# Patient Record
Sex: Female | Born: 1985 | Race: White | Hispanic: No | Smoking: Never smoker
Health system: Southern US, Community
[De-identification: ages and names within clinical notes are randomized; demographics above are authoritative.]

## PROBLEM LIST (undated history)

## (undated) DIAGNOSIS — D649 Anemia, unspecified: Secondary | ICD-10-CM

---
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
## 2004-12-10 HISTORY — PX: RADIAL KERATOTOMY: SHX217

## 2017-11-08 ENCOUNTER — Encounter (HOSPITAL_COMMUNITY): Payer: Self-pay

## 2017-11-12 ENCOUNTER — Ambulatory Visit (HOSPITAL_COMMUNITY)
Admission: RE | Admit: 2017-11-12 | Discharge: 2017-11-12 | Disposition: A | Discharge status: Home / Self Care | Payer: No Typology Code available for payment source | Source: Ambulatory Visit | Attending: Nurse Practitioner | Admitting: Nurse Practitioner

## 2017-11-12 ENCOUNTER — Encounter (HOSPITAL_COMMUNITY): Payer: Self-pay

## 2017-11-12 DIAGNOSIS — Z3A12 12 weeks gestation of pregnancy: Secondary | ICD-10-CM | POA: Insufficient documentation

## 2017-11-12 DIAGNOSIS — O30031 Twin pregnancy, monochorionic/diamniotic, first trimester: Secondary | ICD-10-CM | POA: Diagnosis present

## 2017-11-12 DIAGNOSIS — O30019 Twin pregnancy, monochorionic/monoamniotic, unspecified trimester: Secondary | ICD-10-CM | POA: Insufficient documentation

## 2017-11-12 HISTORY — DX: Anemia, unspecified: D64.9

## 2017-11-12 NOTE — Consult Note (Signed)
Maternal Fetal Medicine Consultation  Requesting Provider(s): Arlyn LeakErskine (NP)  [facility] Reason for consultation: Monochorionic diamniotic twin gestation  HPI: 31yo [id number] at 12+1 weeks with a reported monochorionic/diamniotic twin gestation. This [facility]. We did not perform US to confirm this diagnosis today. I have reviewed the images from the scan on 11/07/2017 and they are suggestive but not diagnostic of monochorionic/diamniotic twin gestation. She has had c. 7 pound weight gain. She has nausea but no current vomiting OB History: OB History    Gravida Para Term Preterm AB Living   7 3 3   3 3    SAB TAB Ectopic Multiple Live Births   3            1.2008 C/S at term due to failure to progress, dilated to 8cm. Baby weight 8#8oz 2. 2009 8 week complete SAB 3. 2011 planned repeat C/S at term 8#13oz 4. 2013 incomplete SAB of confirmed monochorionic/monoamniotic twins at 7 weeks, required a D&C 5. 2015 planned repeat C/S at term, 8#8oz. 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 2016 8 weeks complete SAB  PMH:  Past Medical History:  Diagnosis Date  . Anemia     PSH:  Past Surgical History:  Procedure Laterality Date  . CESAREAN SECTION    . KNEE ARTHROSCOPY Left 2003  . RADIAL KERATOTOMY Bilateral 2006   Meds: PNV Allergies: NKDA FH: See EPIC section Soc: See EPIC section  Review of Systems: no vaginal bleeding or cramping/contractions, no LOF, no nausea/vomiting. All other systems reviewed and are negative.  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 We discussed the increased risks for adverse outcomes entailed by any type of twin pregnancy. These included increased risk of PTD,  preeclampsia and gestational diabetes. I have asked her to start 81mg  ASA for preeclampsia prevention. We will plan periodic transvaginal cervical evaluations, the first being at 18 weeks, and repeated every 2 to 4 weeks through 26 weeks to screen for elevated risk for PTD. Standard screening for GDM is all that is indicated at this time.  We then discussed increased risk for structural anomalies. This is elevated in all twin gestations, but is higher in MCDA than in DCDA gestations. Further, there is an increased risk for fetal cardiac defects. This is exacerbated by her history of a previous child with a VSD, even if it did close spontaneously. To that end we will plan anatomic survey at 18 weeks, and I have set her up for fetal echocardiography when the EGA is appropriate.  We then discussed the risk for complications specific to MCDA gestations, including twin-to-twin transfusion syndrome (TTTS), twin reversed arterial perfusion sequence (TRAP) and selective fetal growth restriction. TRAP will be easily identified at her first US and is unlikely. We will begin TTTS surveillance at 16 weeks and will perform it every 2 weeks until delivery. We will check growth at the anatomic survey at 18 weeks then every 4 weeks thereafter. We would recommend antepartum testing with weekly BPP after 30 weeks  Delivery should be at 37+0 weeks if the remainder of her pregnancy is uncomplicated. Expert opinion states that delivery from 36-37 weeks is acceptable for uncomplicated MCDA twins  Thank you for the opportunity to be  a part of the care of [facility]. Please contact our office if we can be of further assistance.   I spent approximately 40 minutes with this patient with over 50% of time spent in face-to-face counseling.

## 2017-11-13 ENCOUNTER — Encounter (HOSPITAL_COMMUNITY): Payer: Self-pay

## 2017-11-14 ENCOUNTER — Other Ambulatory Visit (HOSPITAL_COMMUNITY): Payer: Self-pay | Admitting: *Deleted

## 2017-11-14 DIAGNOSIS — O30039 Twin pregnancy, monochorionic/diamniotic, unspecified trimester: Secondary | ICD-10-CM

## 2017-12-13 ENCOUNTER — Ambulatory Visit (HOSPITAL_COMMUNITY)
Admission: RE | Admit: 2017-12-13 | Discharge: 2017-12-13 | Disposition: A | Discharge status: Home / Self Care | Payer: No Typology Code available for payment source | Source: Ambulatory Visit | Attending: Nurse Practitioner | Admitting: Nurse Practitioner

## 2017-12-13 ENCOUNTER — Encounter (HOSPITAL_COMMUNITY): Payer: Self-pay

## 2017-12-13 DIAGNOSIS — Z3A16 16 weeks gestation of pregnancy: Secondary | ICD-10-CM | POA: Diagnosis not present

## 2017-12-13 DIAGNOSIS — O09292 Supervision of pregnancy with other poor reproductive or obstetric history, second trimester: Secondary | ICD-10-CM | POA: Diagnosis not present

## 2017-12-13 DIAGNOSIS — O322XX2 Maternal care for transverse and oblique lie, fetus 2: Secondary | ICD-10-CM | POA: Insufficient documentation

## 2017-12-13 DIAGNOSIS — O30032 Twin pregnancy, monochorionic/diamniotic, second trimester: Secondary | ICD-10-CM | POA: Diagnosis present

## 2017-12-13 DIAGNOSIS — O34219 Maternal care for unspecified type scar from previous cesarean delivery: Secondary | ICD-10-CM | POA: Insufficient documentation

## 2017-12-13 DIAGNOSIS — O30039 Twin pregnancy, monochorionic/diamniotic, unspecified trimester: Secondary | ICD-10-CM

## 2017-12-23 ENCOUNTER — Encounter (HOSPITAL_COMMUNITY): Payer: Self-pay | Admitting: Nurse Practitioner

## 2017-12-27 ENCOUNTER — Other Ambulatory Visit (HOSPITAL_COMMUNITY): Payer: Self-pay | Admitting: Obstetrics and Gynecology

## 2017-12-27 ENCOUNTER — Other Ambulatory Visit (HOSPITAL_COMMUNITY): Payer: Self-pay | Admitting: *Deleted

## 2017-12-27 ENCOUNTER — Encounter (HOSPITAL_COMMUNITY): Payer: Self-pay

## 2017-12-27 ENCOUNTER — Ambulatory Visit (HOSPITAL_COMMUNITY)
Admission: RE | Admit: 2017-12-27 | Discharge: 2017-12-27 | Disposition: A | Discharge status: Home / Self Care | Payer: No Typology Code available for payment source | Source: Ambulatory Visit | Attending: Nurse Practitioner | Admitting: Nurse Practitioner

## 2017-12-27 DIAGNOSIS — O34219 Maternal care for unspecified type scar from previous cesarean delivery: Secondary | ICD-10-CM

## 2017-12-27 DIAGNOSIS — O30032 Twin pregnancy, monochorionic/diamniotic, second trimester: Secondary | ICD-10-CM | POA: Insufficient documentation

## 2017-12-27 DIAGNOSIS — Z363 Encounter for antenatal screening for malformations: Secondary | ICD-10-CM | POA: Insufficient documentation

## 2017-12-27 DIAGNOSIS — O30039 Twin pregnancy, monochorionic/diamniotic, unspecified trimester: Secondary | ICD-10-CM

## 2017-12-27 DIAGNOSIS — Z3A18 18 weeks gestation of pregnancy: Secondary | ICD-10-CM | POA: Insufficient documentation

## 2018-01-10 ENCOUNTER — Encounter (HOSPITAL_COMMUNITY): Payer: Self-pay

## 2018-01-10 ENCOUNTER — Ambulatory Visit (HOSPITAL_COMMUNITY)
Admission: RE | Admit: 2018-01-10 | Discharge: 2018-01-10 | Disposition: A | Discharge status: Home / Self Care | Payer: No Typology Code available for payment source | Source: Ambulatory Visit | Attending: Nurse Practitioner | Admitting: Nurse Practitioner

## 2018-01-10 DIAGNOSIS — O30039 Twin pregnancy, monochorionic/diamniotic, unspecified trimester: Secondary | ICD-10-CM | POA: Diagnosis not present

## 2018-01-10 DIAGNOSIS — Z3A20 20 weeks gestation of pregnancy: Secondary | ICD-10-CM | POA: Insufficient documentation

## 2018-01-17 ENCOUNTER — Other Ambulatory Visit (HOSPITAL_COMMUNITY): Payer: Self-pay

## 2018-01-24 ENCOUNTER — Other Ambulatory Visit (HOSPITAL_COMMUNITY): Payer: Self-pay | Admitting: Obstetrics and Gynecology

## 2018-01-24 ENCOUNTER — Encounter (HOSPITAL_COMMUNITY): Payer: Self-pay

## 2018-01-24 ENCOUNTER — Ambulatory Visit (HOSPITAL_COMMUNITY)
Admission: RE | Admit: 2018-01-24 | Discharge: 2018-01-24 | Disposition: A | Discharge status: Home / Self Care | Payer: No Typology Code available for payment source | Source: Ambulatory Visit | Attending: Nurse Practitioner | Admitting: Nurse Practitioner

## 2018-01-24 DIAGNOSIS — O30032 Twin pregnancy, monochorionic/diamniotic, second trimester: Secondary | ICD-10-CM | POA: Diagnosis present

## 2018-01-24 DIAGNOSIS — Z3A22 22 weeks gestation of pregnancy: Secondary | ICD-10-CM

## 2018-01-24 DIAGNOSIS — O09299 Supervision of pregnancy with other poor reproductive or obstetric history, unspecified trimester: Secondary | ICD-10-CM

## 2018-01-24 DIAGNOSIS — O34219 Maternal care for unspecified type scar from previous cesarean delivery: Secondary | ICD-10-CM | POA: Diagnosis not present

## 2018-01-24 DIAGNOSIS — O30039 Twin pregnancy, monochorionic/diamniotic, unspecified trimester: Secondary | ICD-10-CM

## 2018-01-24 DIAGNOSIS — Z362 Encounter for other antenatal screening follow-up: Secondary | ICD-10-CM | POA: Diagnosis not present

## 2018-01-24 DIAGNOSIS — O09292 Supervision of pregnancy with other poor reproductive or obstetric history, second trimester: Secondary | ICD-10-CM | POA: Insufficient documentation

## 2018-02-07 ENCOUNTER — Other Ambulatory Visit (HOSPITAL_COMMUNITY): Payer: Self-pay | Admitting: Obstetrics and Gynecology

## 2018-02-07 ENCOUNTER — Ambulatory Visit (HOSPITAL_COMMUNITY)
Admission: RE | Admit: 2018-02-07 | Discharge: 2018-02-07 | Disposition: A | Discharge status: Home / Self Care | Payer: No Typology Code available for payment source | Source: Ambulatory Visit | Attending: Nurse Practitioner | Admitting: Nurse Practitioner

## 2018-02-07 ENCOUNTER — Encounter (HOSPITAL_COMMUNITY): Payer: Self-pay

## 2018-02-07 DIAGNOSIS — O30039 Twin pregnancy, monochorionic/diamniotic, unspecified trimester: Secondary | ICD-10-CM

## 2018-02-07 DIAGNOSIS — O09292 Supervision of pregnancy with other poor reproductive or obstetric history, second trimester: Secondary | ICD-10-CM | POA: Diagnosis not present

## 2018-02-07 DIAGNOSIS — O30032 Twin pregnancy, monochorionic/diamniotic, second trimester: Secondary | ICD-10-CM | POA: Diagnosis present

## 2018-02-07 DIAGNOSIS — Z3A24 24 weeks gestation of pregnancy: Secondary | ICD-10-CM | POA: Insufficient documentation

## 2018-02-07 DIAGNOSIS — O34219 Maternal care for unspecified type scar from previous cesarean delivery: Secondary | ICD-10-CM | POA: Insufficient documentation

## 2018-02-21 ENCOUNTER — Ambulatory Visit (HOSPITAL_COMMUNITY)
Admission: RE | Admit: 2018-02-21 | Discharge: 2018-02-21 | Disposition: A | Discharge status: Home / Self Care | Payer: No Typology Code available for payment source | Source: Ambulatory Visit | Attending: Nurse Practitioner | Admitting: Nurse Practitioner

## 2018-02-21 ENCOUNTER — Encounter (HOSPITAL_COMMUNITY): Payer: Self-pay

## 2018-02-21 ENCOUNTER — Other Ambulatory Visit (HOSPITAL_COMMUNITY): Payer: Self-pay | Admitting: Obstetrics and Gynecology

## 2018-02-21 DIAGNOSIS — Z3A26 26 weeks gestation of pregnancy: Secondary | ICD-10-CM

## 2018-02-21 DIAGNOSIS — O34219 Maternal care for unspecified type scar from previous cesarean delivery: Secondary | ICD-10-CM | POA: Diagnosis not present

## 2018-02-21 DIAGNOSIS — O09292 Supervision of pregnancy with other poor reproductive or obstetric history, second trimester: Secondary | ICD-10-CM | POA: Diagnosis not present

## 2018-02-21 DIAGNOSIS — O30039 Twin pregnancy, monochorionic/diamniotic, unspecified trimester: Secondary | ICD-10-CM

## 2018-02-21 DIAGNOSIS — O09299 Supervision of pregnancy with other poor reproductive or obstetric history, unspecified trimester: Secondary | ICD-10-CM

## 2018-02-21 DIAGNOSIS — O30032 Twin pregnancy, monochorionic/diamniotic, second trimester: Secondary | ICD-10-CM | POA: Insufficient documentation

## 2018-02-21 NOTE — ED Notes (Signed)
Pt reports itching all over, bile acids drawn 02/19/18.

## 2018-03-07 ENCOUNTER — Ambulatory Visit (HOSPITAL_COMMUNITY)
Admission: RE | Admit: 2018-03-07 | Discharge: 2018-03-07 | Disposition: A | Discharge status: Home / Self Care | Payer: No Typology Code available for payment source | Source: Ambulatory Visit | Attending: Nurse Practitioner | Admitting: Nurse Practitioner

## 2018-03-07 ENCOUNTER — Encounter (HOSPITAL_COMMUNITY): Payer: Self-pay

## 2018-03-07 ENCOUNTER — Other Ambulatory Visit (HOSPITAL_COMMUNITY): Payer: Self-pay | Admitting: Obstetrics and Gynecology

## 2018-03-07 DIAGNOSIS — O09293 Supervision of pregnancy with other poor reproductive or obstetric history, third trimester: Secondary | ICD-10-CM

## 2018-03-07 DIAGNOSIS — Z3A28 28 weeks gestation of pregnancy: Secondary | ICD-10-CM | POA: Diagnosis not present

## 2018-03-07 DIAGNOSIS — O34219 Maternal care for unspecified type scar from previous cesarean delivery: Secondary | ICD-10-CM | POA: Diagnosis not present

## 2018-03-07 DIAGNOSIS — O30032 Twin pregnancy, monochorionic/diamniotic, second trimester: Secondary | ICD-10-CM | POA: Insufficient documentation

## 2018-03-07 DIAGNOSIS — O30039 Twin pregnancy, monochorionic/diamniotic, unspecified trimester: Secondary | ICD-10-CM

## 2018-03-21 ENCOUNTER — Other Ambulatory Visit (HOSPITAL_COMMUNITY): Payer: Self-pay | Admitting: *Deleted

## 2018-03-21 ENCOUNTER — Ambulatory Visit (HOSPITAL_COMMUNITY)
Admission: RE | Admit: 2018-03-21 | Discharge: 2018-03-21 | Disposition: A | Discharge status: Home / Self Care | Payer: No Typology Code available for payment source | Source: Ambulatory Visit | Attending: Nurse Practitioner | Admitting: Nurse Practitioner

## 2018-03-21 ENCOUNTER — Other Ambulatory Visit (HOSPITAL_COMMUNITY): Payer: Self-pay | Admitting: Obstetrics and Gynecology

## 2018-03-21 ENCOUNTER — Encounter (HOSPITAL_COMMUNITY): Payer: Self-pay

## 2018-03-21 DIAGNOSIS — O34219 Maternal care for unspecified type scar from previous cesarean delivery: Secondary | ICD-10-CM | POA: Diagnosis not present

## 2018-03-21 DIAGNOSIS — Z3A30 30 weeks gestation of pregnancy: Secondary | ICD-10-CM | POA: Insufficient documentation

## 2018-03-21 DIAGNOSIS — O09293 Supervision of pregnancy with other poor reproductive or obstetric history, third trimester: Secondary | ICD-10-CM | POA: Diagnosis not present

## 2018-03-21 DIAGNOSIS — O30033 Twin pregnancy, monochorionic/diamniotic, third trimester: Secondary | ICD-10-CM | POA: Insufficient documentation

## 2018-03-21 DIAGNOSIS — O09299 Supervision of pregnancy with other poor reproductive or obstetric history, unspecified trimester: Secondary | ICD-10-CM

## 2018-03-21 DIAGNOSIS — O30039 Twin pregnancy, monochorionic/diamniotic, unspecified trimester: Secondary | ICD-10-CM

## 2018-04-04 ENCOUNTER — Encounter (HOSPITAL_COMMUNITY): Payer: Self-pay

## 2018-04-04 ENCOUNTER — Ambulatory Visit (HOSPITAL_COMMUNITY)
Admission: RE | Admit: 2018-04-04 | Discharge: 2018-04-04 | Disposition: A | Discharge status: Home / Self Care | Payer: No Typology Code available for payment source | Source: Ambulatory Visit | Attending: Nurse Practitioner | Admitting: Nurse Practitioner

## 2018-04-04 ENCOUNTER — Other Ambulatory Visit (HOSPITAL_COMMUNITY): Payer: Self-pay | Admitting: Obstetrics and Gynecology

## 2018-04-04 DIAGNOSIS — O34219 Maternal care for unspecified type scar from previous cesarean delivery: Secondary | ICD-10-CM | POA: Insufficient documentation

## 2018-04-04 DIAGNOSIS — O09299 Supervision of pregnancy with other poor reproductive or obstetric history, unspecified trimester: Secondary | ICD-10-CM

## 2018-04-04 DIAGNOSIS — Z3A32 32 weeks gestation of pregnancy: Secondary | ICD-10-CM | POA: Diagnosis not present

## 2018-04-04 DIAGNOSIS — O09293 Supervision of pregnancy with other poor reproductive or obstetric history, third trimester: Secondary | ICD-10-CM | POA: Insufficient documentation

## 2018-04-04 DIAGNOSIS — O30039 Twin pregnancy, monochorionic/diamniotic, unspecified trimester: Secondary | ICD-10-CM

## 2018-04-04 DIAGNOSIS — O30033 Twin pregnancy, monochorionic/diamniotic, third trimester: Secondary | ICD-10-CM | POA: Diagnosis not present

## 2018-04-18 ENCOUNTER — Encounter (HOSPITAL_COMMUNITY): Payer: Self-pay

## 2018-04-18 ENCOUNTER — Other Ambulatory Visit (HOSPITAL_COMMUNITY): Payer: Self-pay | Admitting: Maternal and Fetal Medicine

## 2018-04-18 ENCOUNTER — Other Ambulatory Visit (HOSPITAL_COMMUNITY): Payer: Self-pay | Admitting: Obstetrics and Gynecology

## 2018-04-18 ENCOUNTER — Ambulatory Visit (HOSPITAL_COMMUNITY)
Admission: RE | Admit: 2018-04-18 | Discharge: 2018-04-18 | Disposition: A | Discharge status: Home / Self Care | Payer: No Typology Code available for payment source | Source: Ambulatory Visit | Attending: Nurse Practitioner | Admitting: Nurse Practitioner

## 2018-04-18 DIAGNOSIS — O34219 Maternal care for unspecified type scar from previous cesarean delivery: Secondary | ICD-10-CM

## 2018-04-18 DIAGNOSIS — O09299 Supervision of pregnancy with other poor reproductive or obstetric history, unspecified trimester: Secondary | ICD-10-CM

## 2018-04-18 DIAGNOSIS — Z3A34 34 weeks gestation of pregnancy: Secondary | ICD-10-CM

## 2018-04-18 DIAGNOSIS — O09293 Supervision of pregnancy with other poor reproductive or obstetric history, third trimester: Secondary | ICD-10-CM | POA: Insufficient documentation

## 2018-04-18 DIAGNOSIS — O30039 Twin pregnancy, monochorionic/diamniotic, unspecified trimester: Secondary | ICD-10-CM

## 2018-04-18 DIAGNOSIS — O30033 Twin pregnancy, monochorionic/diamniotic, third trimester: Secondary | ICD-10-CM | POA: Insufficient documentation

## 2018-05-02 ENCOUNTER — Other Ambulatory Visit (HOSPITAL_COMMUNITY): Payer: No Typology Code available for payment source

## 2018-07-14 IMAGING — US US MFM OB LIMITED
2 series · 14 of 26 positions shown · non-contrast
Comparison: none

[Series 1: us mfm ob limited · 25 acquisitions, 13 frames shown (1 of 2)]
[im 1/25]
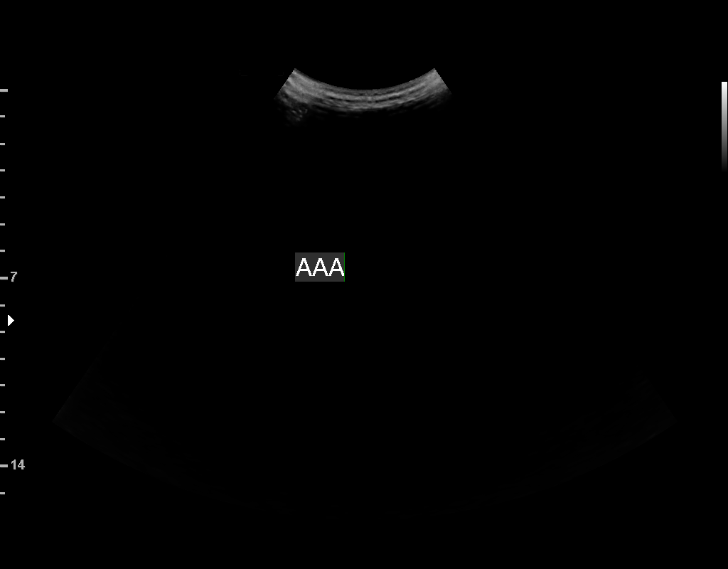
[im 3/25]
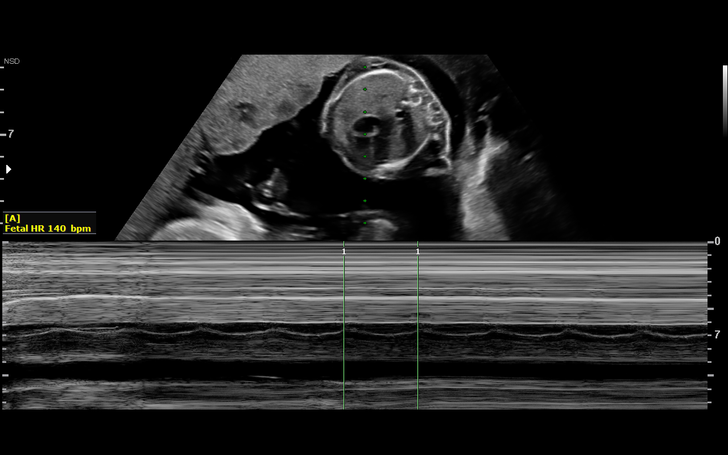
[im 5/25]
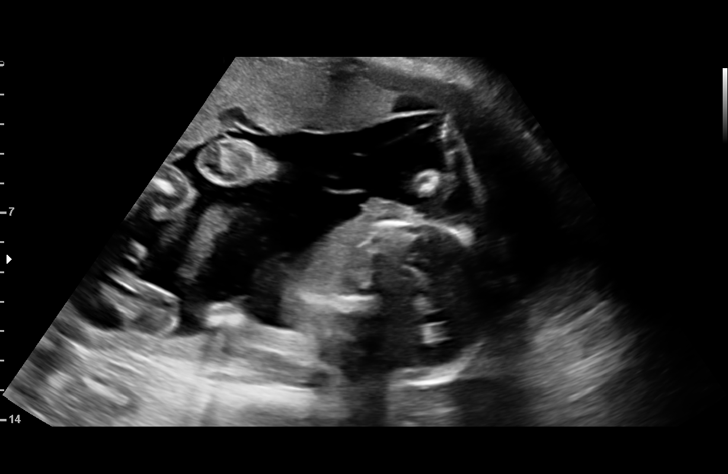
[im 7/25]
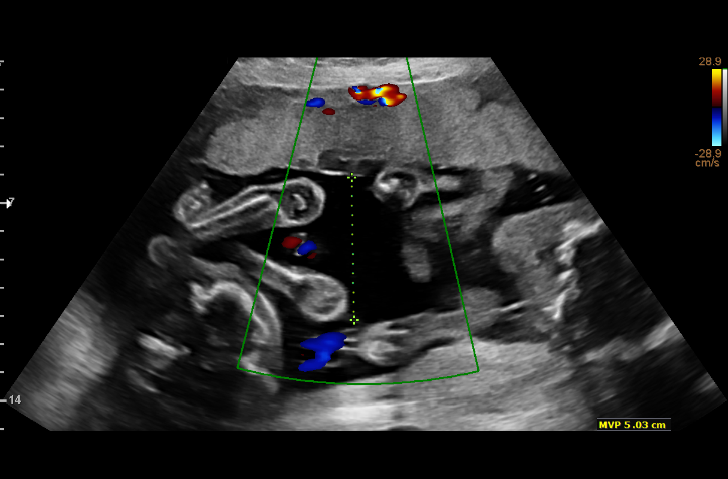
[im 9/25]
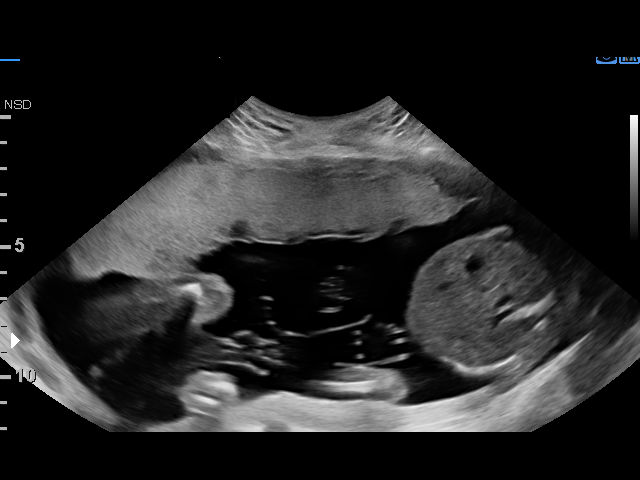
[im 11/25]
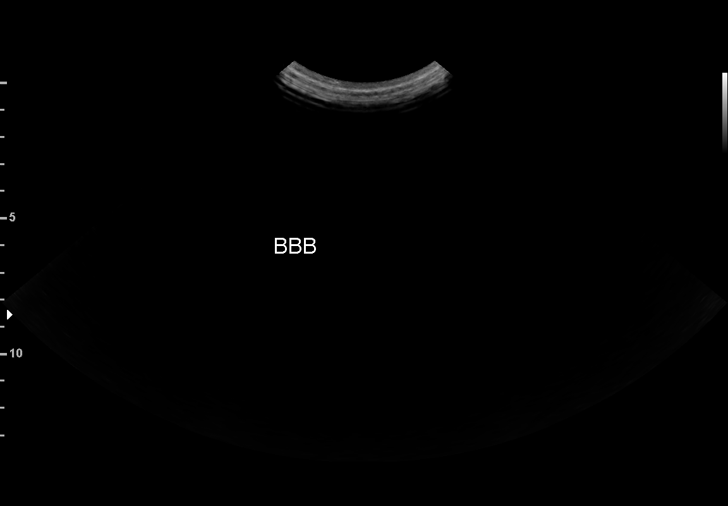
[im 13/25]
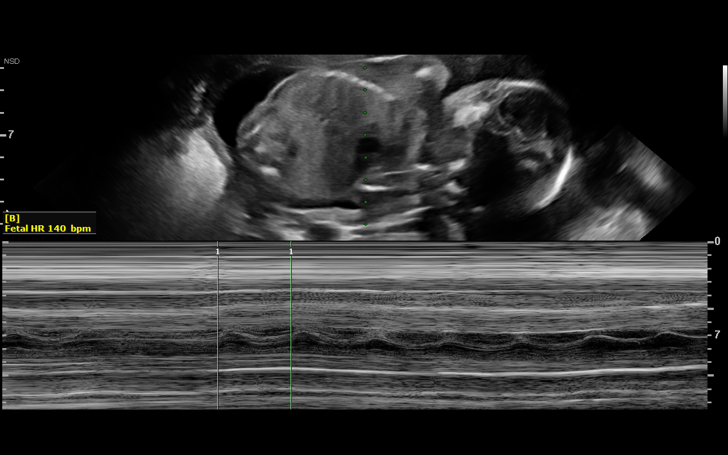
[im 14/25]
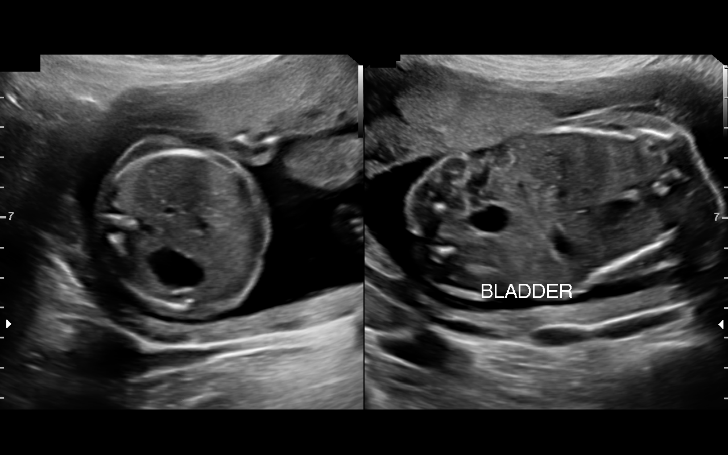
[im 16/25]
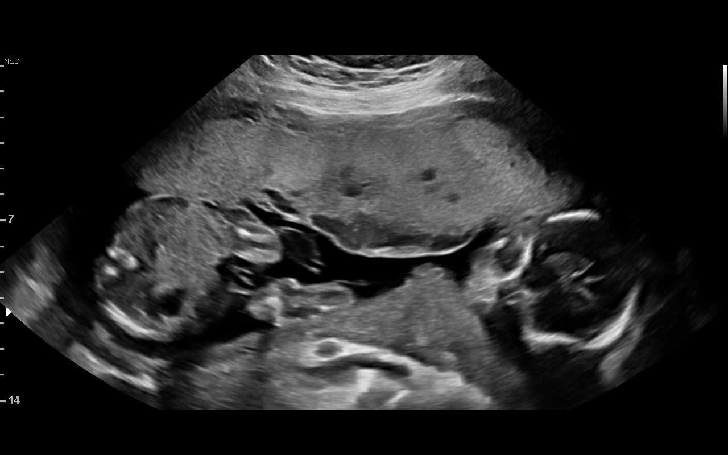
[im 18/25]
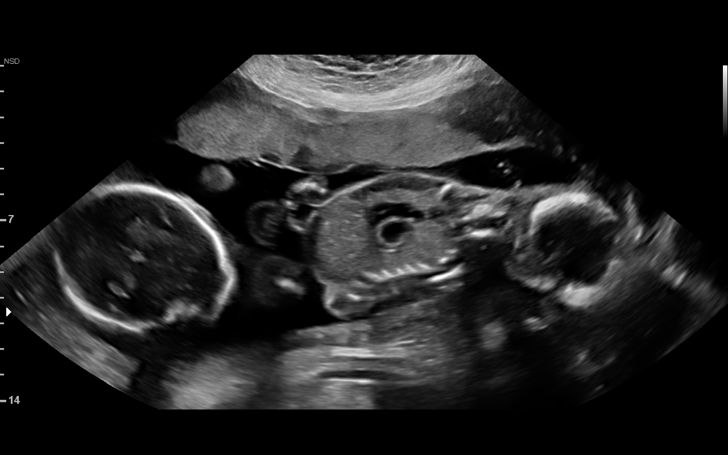
[im 20/25]
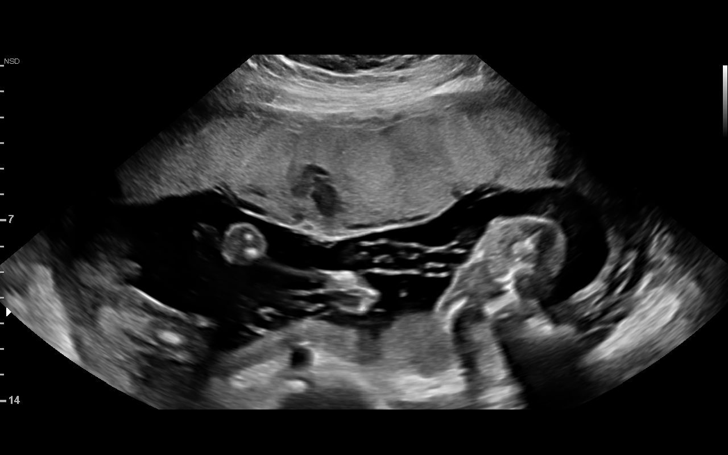
[im 22/25]
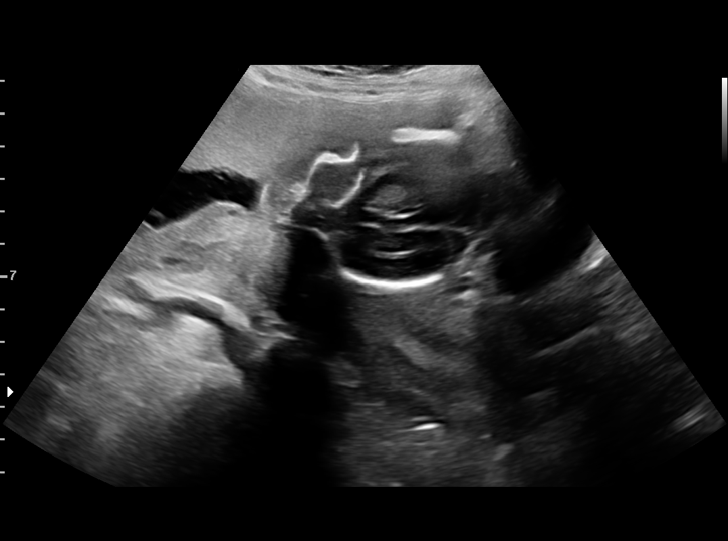
[im 24/25]
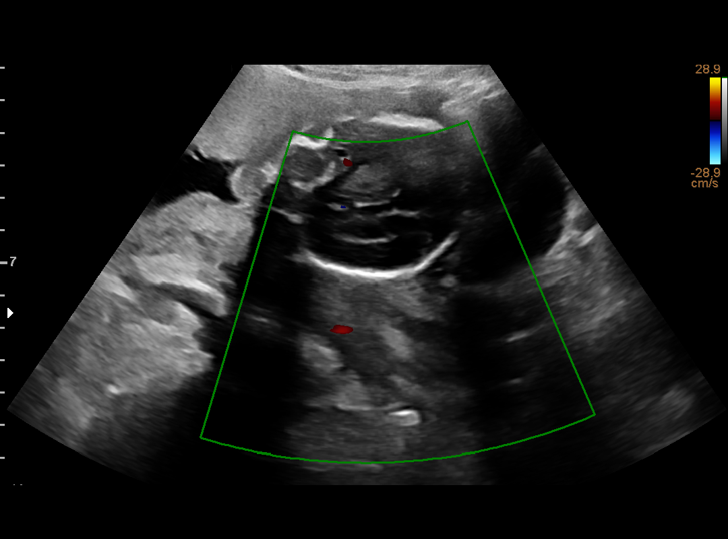

[Series 3: us mfm ob limited · 1 of 1 slices shown (2 of 2)]
[im 1/1]
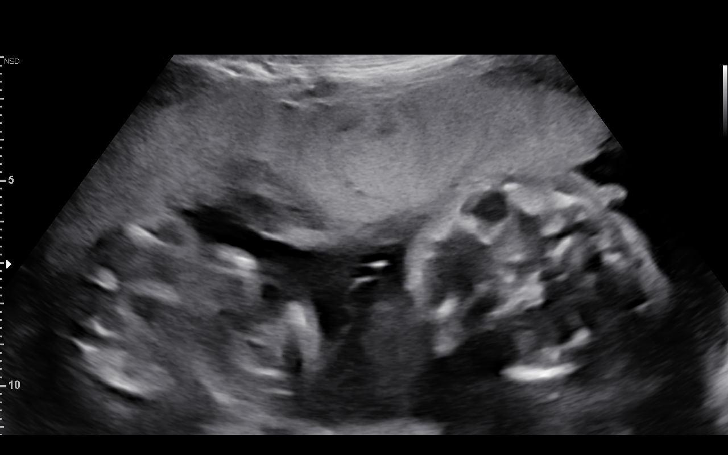

[14 of 26 positions shown; findings below may reference images not displayed]

Centre
[address]

Indications

20 weeks gestation of pregnancy
Twin pregnancy, Nasrallah/Ip, second trimester
History of cesarean delivery, currently
pregnant ( x3)
Poor obstetrical history (Prior child with
cardiac defect)
Encounter for antenatal screening for
malformations
OB History

Blood Type:            Height:  5'9"   Weight (lb):  201       BMI:
Gravidity:    7         Term:   3         SAB:   3
Living:       3
Fetal Evaluation (Fetus A)

Num Of Fetuses:     2
Fetal Heart         140
Rate(bpm):
Cardiac Activity:   Observed
Fetal Lie:          Maternal left side
Presentation:       Cephalic
Placenta:           Anterior, above cervical os
P. Cord Insertion:  Previously Visualized
Membrane Desc:      Dividing Membrane seen

Amniotic Fluid
AFI FV:      Subjectively within normal limits

Largest Pocket(cm)
5.03
Gestational Age (Fetus A)

LMP:           20w 4d        Date:  08/19/17                 EDD:   05/26/18
Best:          20w 4d     Det. By:  LMP  (08/19/17)          EDD:   05/26/18

Fetal Evaluation (Fetus B)

Num Of Fetuses:     2
Fetal Heart         140
Rate(bpm):
Cardiac Activity:   Observed
Fetal Lie:          Upper Fetus
Presentation:       Transverse, head to maternal left
Placenta:           Anterior, above cervical os
P. Cord Insertion:  Previously Visualized
Membrane Desc:      Dividing Membrane seen

Amniotic Fluid
AFI FV:      Subjectively within normal limits

Largest Pocket(cm)
6.22
Gestational Age (Fetus B)

LMP:           20w 4d        Date:  08/19/17                 EDD:   05/26/18
Best:          20w 4d     Det. By:  LMP  (08/19/17)          EDD:   05/26/18
Cervix Uterus Adnexa

Cervix
Length:            4.4  cm.
Normal appearance by transabdominal scan.

Left Ovary
Previously seen.

Right Ovary
Previously seen
Impression

Monochorionic/diamniotic twin pregnancy at 20 weeks 4 days
gestation with fetal cardaic activity x 2
Cephalic / Transverse presentation
Anterior placenta

Normal amniotic fluid volume x 2

No evidence of TTTS
Cervix is long and closed
Recommendations

Growth US and TTTS check in 2 weeks

## 2018-08-18 ENCOUNTER — Encounter (HOSPITAL_COMMUNITY): Payer: Self-pay

## 2018-10-20 IMAGING — US US MFM OB FOLLOW-UP EACH ADDL GEST (MODIFY)
1 series · 12 of 28 positions shown · non-contrast
Comparison: none

[Series 1: us mfm ob follow-up each addl gest (modify) · 64 acquisitions, 12 frames shown]
[im 3/64]
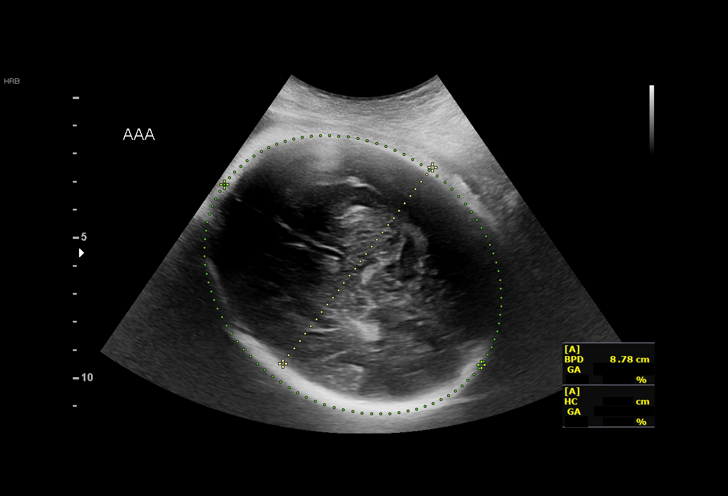
[im 8/64]
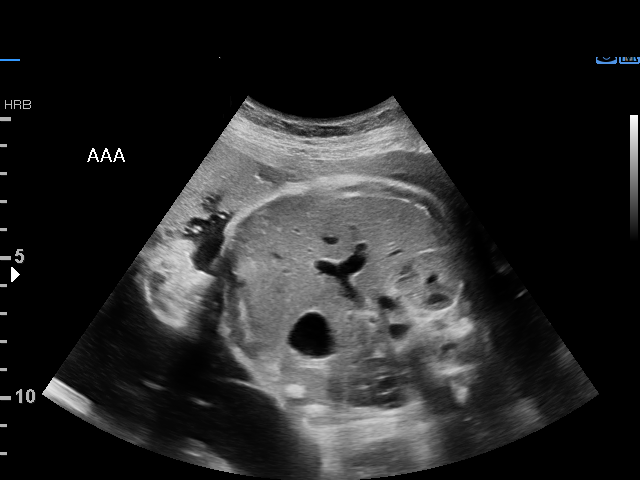
[im 12/64]
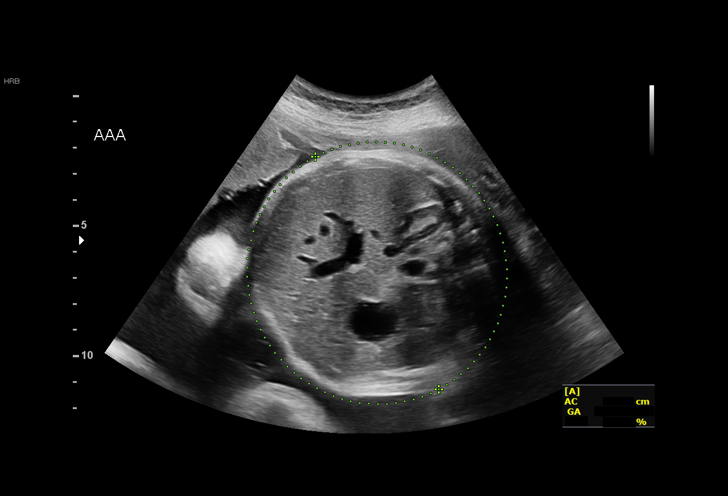
[im 19/64]
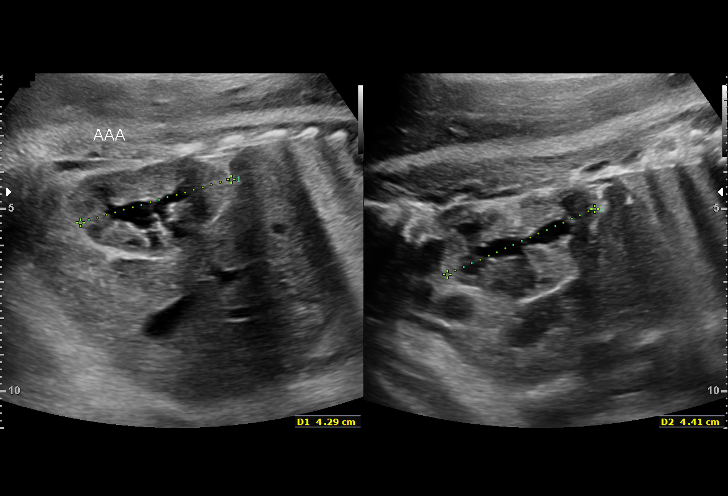
[im 24/64]
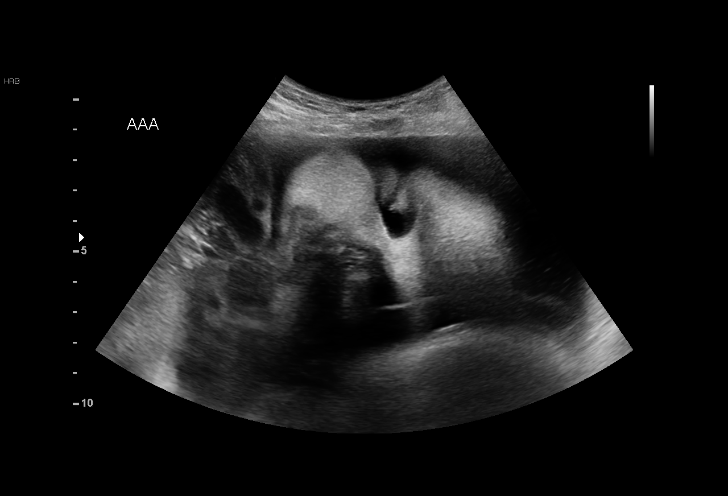
[im 29/64]
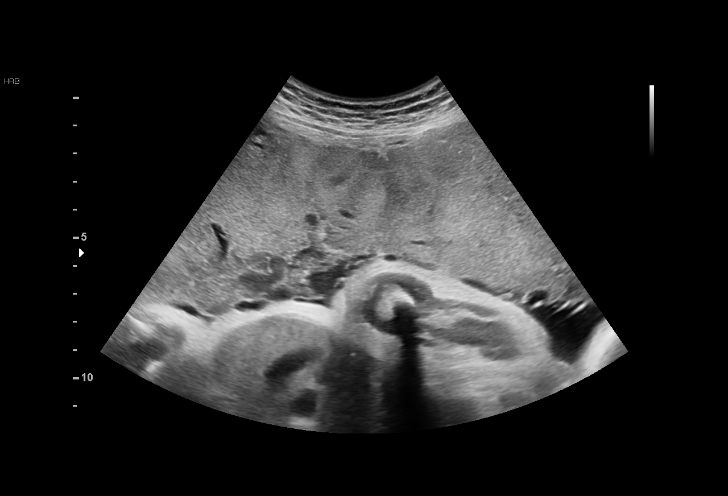
[im 36/64]
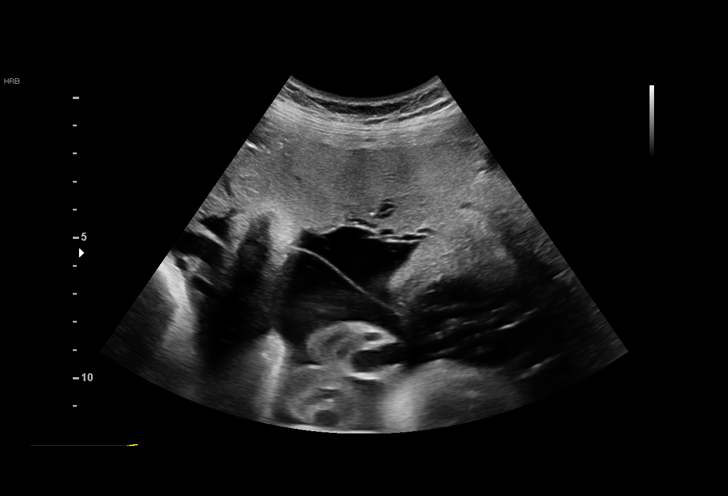
[im 40/64]
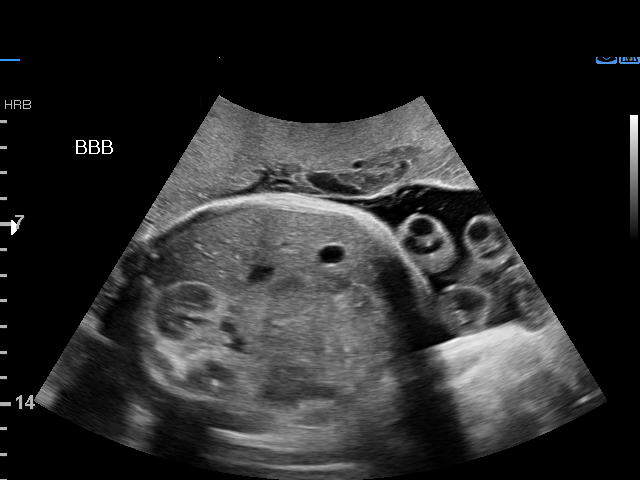
[im 45/64]
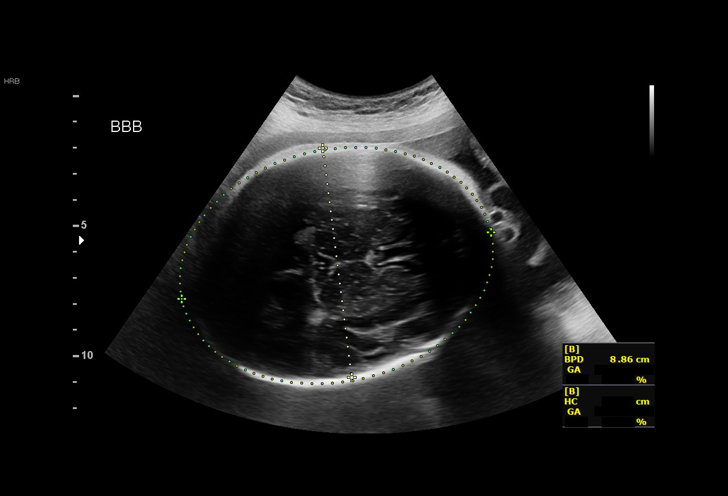
[im 52/64]
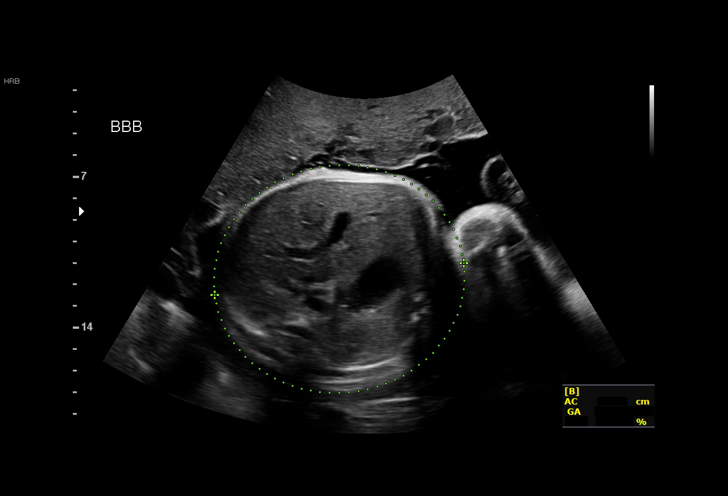
[im 57/64]
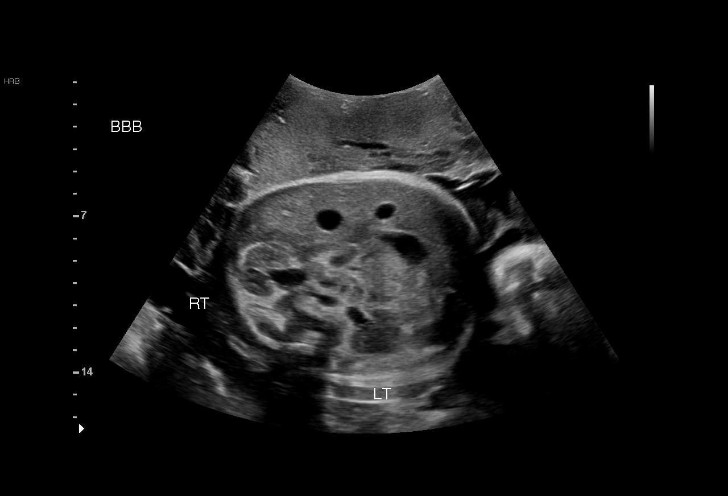
[im 61/64]
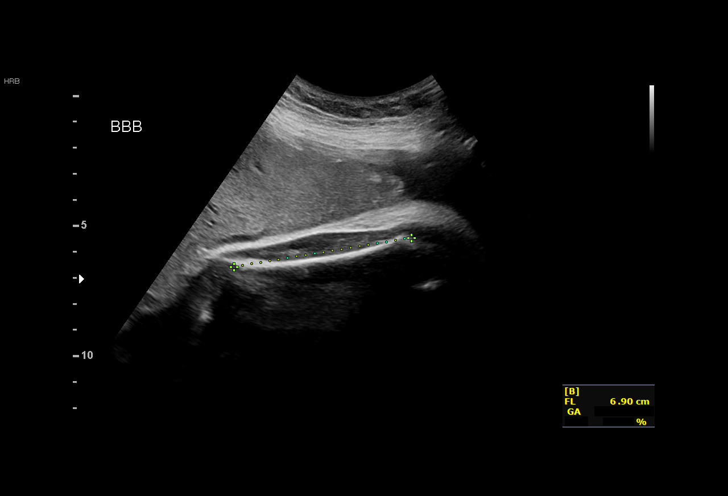

[12 of 28 positions shown; findings below may reference images not displayed]

Centre
[address]

GESTATION

3  DABLIK KUNNEN            [id number]      [id number]     [id number]
4  DABLIK KUNNEN            [id number]      [id number]     [id number]
Indications

34 weeks gestation of pregnancy
History of cesarean delivery, currently
pregnant (x3)
Poor obstetrical history (Prior child with
cardiac defect)
Twin pregnancy, Ferienhaus/Arleen, third trimester
(normal ECHO X 2)
OB History

Blood Type:            Height:  5'9"   Weight (lb):  201       BMI:
Gravidity:    7         Term:   3         SAB:   3
Living:       3
Fetal Evaluation (Fetus A)

Num Of Fetuses:     2
Fetal Heart         145
Rate(bpm):
Cardiac Activity:   Observed
Fetal Lie:          Maternal left side
Presentation:       Cephalic
Placenta:           Anterior, above cervical os
P. Cord Insertion:  Previously Visualized
Membrane Desc:      Dividing Membrane seen - Monochorionic
Amniotic Fluid
AFI FV:      Subjectively within normal limits

Largest Pocket(cm)
4.0
Biophysical Evaluation (Fetus A)

Amniotic F.V:   Within normal limits       F. Tone:         Observed
F. Movement:    Observed                   Score:           [DATE]
F. Breathing:   Observed
Biometry (Fetus A)

BPD:      88.4  mm     G. Age:  35w 5d         81  %    CI:         74.49  %    70 - 86
FL/HC:       21.5  %    20.1 -
HC:      325.1  mm     G. Age:  36w 6d         72  %    HC/AC:       1.03       0.93 -
AC:      315.9  mm     G. Age:  35w 3d         80  %    FL/BPD:      79.2  %    71 - 87
FL:         70  mm     G. Age:  35w 6d         76  %    FL/AC:       22.2  %    20 - 24
HUM:      63.1  mm     G. Age:  36w 5d       > 95  %

Est. FW:    4022   gm     6 lb 2 oz     80  %     FW Discordancy        17   %
Gestational Age (Fetus A)

LMP:           34w 4d        Date:  08/19/17                 EDD:    05/26/18
U/S Today:     36w 0d                                        EDD:    05/16/18
Best:          34w 4d     Det. By:  LMP  (08/19/17)          EDD:    05/26/18
Anatomy (Fetus A)

Cranium:               Appears normal         Aortic Arch:            Previously seen
Cavum:                 Previously seen        Ductal Arch:            Previously seen
Ventricles:            Previously seen        Diaphragm:              Previously seen
Choroid Plexus:        Previously seen        Stomach:                Appears normal, left
sided
Cerebellum:            Previously seen        Abdomen:                Appears normal
Posterior Fossa:       Previously seen        Abdominal Wall:         Previously seen
Nuchal Fold:           Not applicable (>20    Cord Vessels:           Previously seen
wks GA)
Face:                  Orbits and profile     Kidneys:                Bilat pyelec. Rt
previously seen
7.3mm, Lt 8.6mm
Lips:                  Previously seen        Bladder:                Appears normal
Thoracic:              Appears normal         Spine:                  Previously seen
Heart:                 Appears normal         Upper Extremities:      Previously seen
(4CH, axis, and situs
RVOT:                  Previously seen        Lower Extremities:      Previously seen
LVOT:                  Previously seen

Other:  Fetus appears to be a male. Heels previously visualized. Nasal bone
previously visualized. Technically difficult due to fetal position.

Fetal Evaluation (Fetus B)

Num Of Fetuses:     2
Fetal Heart         133
Rate(bpm):
Cardiac Activity:   Observed
Fetal Lie:          Maternal right side
Presentation:       Breech
Placenta:           Anterior, above cervical os
P. Cord Insertion:  Previously Visualized
Membrane Desc:      Dividing Membrane seen - Monochorionic

Amniotic Fluid
AFI FV:      Subjectively within normal limits

Largest Pocket(cm)
4.73
Biophysical Evaluation (Fetus B)

Amniotic F.V:   Within normal limits       F. Tone:         Observed
F. Movement:    Observed                   Score:           [DATE]
F. Breathing:   Observed
Biometry (Fetus B)

BPD:      87.4  mm     G. Age:  35w 2d         71  %    CI:         69.59  %    70 - 86
FL/HC:       21.1  %    20.1 -
HC:      334.4  mm     G. Age:  38w 2d         93  %    HC/AC:       0.95       0.93 -
AC:      352.1  mm     G. Age:  39w 1d       > 97  %    FL/BPD:      80.7  %    71 - 87
FL:       70.5  mm     G. Age:  36w 1d         80  %    FL/AC:       20.0  %    20 - 24
HUM:      61.1  mm     G. Age:  35w 3d         78  %

Est. FW:    9991   gm     7 lb 6 oz   > 90  %     FW Discordancy     0 \ 17  %
Gestational Age (Fetus B)

LMP:           34w 4d        Date:  08/19/17                 EDD:    05/26/18
U/S Today:     37w 2d                                        EDD:    05/07/18
Best:          34w 4d     Det. By:  LMP  (08/19/17)          EDD:    05/26/18
Anatomy (Fetus B)

Cranium:               Appears normal         Aortic Arch:            Previously seen
Cavum:                 Appears normal         Ductal Arch:            Previously seen
Ventricles:            Appears normal         Diaphragm:              Previously seen
Choroid Plexus:        Previously seen        Stomach:                Appears normal, left
sided
Cerebellum:            Previously seen        Abdomen:                Appears normal
Posterior Fossa:       Previously seen        Abdominal Wall:         Previously seen
Nuchal Fold:           Not applicable (>20    Cord Vessels:           Previously seen
wks GA)
Face:                  Orbits and profile     Kidneys:                Right sided
previously seen
pyelectasis, 8.6mm
Lips:                  Previously seen        Bladder:                Appears normal
Thoracic:              Appears normal         Spine:                  Previously seen
Heart:                 Appears normal         Upper Extremities:      Previously seen
(4CH, axis, and situs
RVOT:                  Previously seen        Lower Extremities:      Previously seen
LVOT:                  Previously seen

Other:  Fetus appears to be a male. Heels previously visualized. Nasal bone
previously visualized. Technically difficult due to fetal position.
Cervix Uterus Adnexa

Cervix
Not visualized (advanced GA >82wks)
Impression

Monochorionic/diamniotic twin pregnancy at 34+4 weeks
Cephalic/breech presentation
UTD A1: Twin A and B
All other interval fetal anatomy was seen and appeared
normal x 2
Normal amniotic fluid volume x 2
EFW: Twin A 80th %tile; Twin B: > 90th %tile; AC > 97th %tile
BPP [DATE] x 2
Recommendations

Continue antenatal testing
Delivery between 36 and 37 weeks
Course of BMZ if prior to 37 weeks
Postnatal evaluation of newborn's kidneys
